# Patient Record
Sex: Male | Born: 1990 | Race: Black or African American | Hispanic: No | Marital: Married | Smoking: Never smoker
Health system: Southern US, Community
[De-identification: ages and names within clinical notes are randomized; demographics above are authoritative.]

## PROBLEM LIST (undated history)

## (undated) DIAGNOSIS — R011 Cardiac murmur, unspecified: Secondary | ICD-10-CM

---
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
## 2017-01-31 ENCOUNTER — Encounter (HOSPITAL_COMMUNITY): Payer: Self-pay | Admitting: Nurse Practitioner

## 2017-01-31 ENCOUNTER — Observation Stay (HOSPITAL_COMMUNITY)
Admission: EM | Admit: 2017-01-31 | Discharge: 2017-02-02 | Disposition: A | Discharge status: Home / Self Care | Payer: 59 | Attending: Internal Medicine | Admitting: Internal Medicine

## 2017-01-31 DIAGNOSIS — M542 Cervicalgia: Secondary | ICD-10-CM | POA: Diagnosis present

## 2017-01-31 DIAGNOSIS — J982 Interstitial emphysema: Principal | ICD-10-CM | POA: Insufficient documentation

## 2017-01-31 DIAGNOSIS — G92 Toxic encephalopathy: Secondary | ICD-10-CM | POA: Diagnosis not present

## 2017-01-31 HISTORY — DX: Cardiac murmur, unspecified: R01.1

## 2017-01-31 MED ORDER — STERILE WATER FOR INJECTION IJ SOLN
INTRAMUSCULAR | Status: AC
  Administered 2017-01-31: 10 mL
  Filled 2017-01-31: qty 10

## 2017-01-31 MED ORDER — ZIPRASIDONE MESYLATE 20 MG IM SOLR
20.0000 mg | Freq: Once | INTRAMUSCULAR | Status: AC
  Administered 2017-01-31: 20 mg via INTRAMUSCULAR
  Filled 2017-01-31: qty 20

## 2017-01-31 NOTE — ED Provider Notes (Signed)
[facility]-EMERGENCY DEPT Provider Note   CSN: [id number] Arrival date & time: 01/31/17  2213  By signing my name below, I, Diona Browner, attest that this documentation has been prepared under the direction and in the presence of [facility], PA-C.  Electronically Signed: Diona Browner, ED Scribe. 01/31/17. 11:22 PM.  LEVEL V CAVEAT: HPI and ROS limited due to altered mental status.   History   Chief Complaint Chief Complaint  Patient presents with  . Hallucinations    HPI Craig Elliott is a 26 y.o. male with a PMHx of anxiety, who presents to the Emergency Department with multiple complaints, He notes he is having a hard time breathing. He can't feel the left side of his body. Pt states that he needs "to keep his spine straight."  States he "I just need to sleep."  States he smoked marijuana cigarette 3 days ago, no other drugs.    Wife states patient has been having trouble sleeping at night x several days.  Today when she got home from work he was explaining that he was having pain in his back, knee, and ankle - was having trouble explaining it, asked her if she could "hear his bones."  He later came to the doorway and mouthed to her that he was having trouble breathing, then dropped, did not pass out.  When EMS arrived he started talking in a way that did not make sense, as he was when he arrived to the ED.    She denies any known psych history for the patient, any known family psych history.   The history is provided by the patient, the spouse and medical records. The history is limited by the condition of the patient. No language interpreter was used.    Past Medical History:  Diagnosis Date  . Heart murmur     There are no active problems to display for this patient.   History reviewed. No pertinent surgical history.     Home Medications    Prior to Admission medications   Not on File    Family History No family history on file.  Social History Social History   Substance Use Topics  . Smoking status: Never Smoker  . Smokeless tobacco: Not on file  . Alcohol use Yes     Allergies   Patient has no known allergies.   Review of Systems Review of Systems  Unable to perform ROS: Mental status change     Physical Exam Updated Vital Signs BP 138/81 (BP Location: Left Arm)   Pulse 84   Resp 18   SpO2 100%   Physical Exam  Constitutional: He appears well-developed and well-nourished. No distress.  HENT:  Head: Normocephalic and atraumatic.  Neck: Neck supple.  Cardiovascular: Normal rate and regular rhythm.   Pulmonary/Chest: Effort normal and breath sounds normal. No respiratory distress. He has no wheezes. He has no rales.  Abdominal: Soft. He exhibits no distension and no mass. There is no tenderness. There is no rebound and no guarding.  Neurological: He is alert. He exhibits normal muscle tone.  Moves erratically and throws his body around the stretcher  Skin: He is not diaphoretic.  Psychiatric: His speech is tangential. He is agitated.  Speech is tangential.  Does not respond to many questions.  Requests blankets and states this will make him okay.  Then states he just needs to sleep. Then jumps up starts talking again.   He is inattentive.  Nursing note and vitals reviewed.  ED Treatments / Results  DIAGNOSTIC STUDIES: Oxygen Saturation is 100% on RA, normal by my interpretation.   COORDINATION OF CARE: 10:47 PM-Discussed next steps with pt. Pt verbalized understanding and is agreeable with the plan.    Labs (all labs ordered are listed, but only abnormal results are displayed) Labs Reviewed  COMPREHENSIVE METABOLIC PANEL - Abnormal; Notable for the following:       Result Value   AST 73 (*)    Alkaline Phosphatase 35 (*)    All other components within normal limits  ACETAMINOPHEN LEVEL - Abnormal; Notable for the following:    Acetaminophen (Tylenol), Serum <10 (*)    All other components within normal limits    ETHANOL  SALICYLATE LEVEL  CBC  RAPID URINE DRUG SCREEN, HOSP PERFORMED    EKG  EKG Interpretation  Date/Time:  Saturday February 01 2017 01:43:45 EDT Ventricular Rate:  68 PR Interval:    QRS Duration: 100 QT Interval:  432 QTC Calculation: 460 R Axis:   87 Text Interpretation:  Sinus rhythm ST elev, probable normal early repol pattern Normal ECG Confirmed by Blinda Leatherwood  MD, CHRISTOPHER [id number]) on 02/01/2017 2:07:21 AM       Radiology Ct Chest Wo Contrast  Result Date: 02/01/2017 CLINICAL DATA:  Followup pneumomediastinum. EXAM: CT CHEST WITHOUT CONTRAST TECHNIQUE: Multidetector CT imaging of the chest was performed following the standard protocol without IV contrast. Oral contrast was administered with contemporaneous scanning. COMPARISON:  CT chest February 01, 2017 at 0329 hours. FINDINGS: CT CHEST FINDINGS- moderate motion degraded examination. CARDIOVASCULAR: Heart and pericardium are unremarkable. Thoracic aorta is normal course and caliber, unremarkable. MEDIASTINUM/NODES: Similar moderate pneumomediastinum. No mediastinal mass. No lymphadenopathy by CT size criteria limited assessment without contrast. Punctate densities LEFT mediastinum are present on prior CT in suggests calcified nodes. No contrast extravasation. Fell pop LUNGS/PLEURA: Tracheobronchial tree is patent, no pneumothorax. No pleural effusions, focal consolidations, pulmonary nodules or masses. MUSCULOSKELETAL: Included soft tissues and included osseous structures appear normal. INCLUDED ABDOMEN:  The contrast in the stomach. IMPRESSION: Moderately motion degraded examination without contrast extravasation or specific etiology for pneumomediastinum. No pneumothorax. Electronically Signed   By: Awilda Metro M.D.   On: 02/01/2017 05:40   Ct Angio Chest Pe W/cm &/or Wo Cm  Result Date: 02/01/2017 CLINICAL DATA:  26 year old male with chest pain and shortness of breath. Earlier substance abuse. EXAM: CT ANGIOGRAPHY CHEST  WITH CONTRAST TECHNIQUE: Multidetector CT imaging of the chest was performed using the standard protocol during bolus administration of intravenous contrast. Multiplanar CT image reconstructions and MIPs were obtained to evaluate the vascular anatomy. CONTRAST:  100 cc Isovue 370 COMPARISON:  None. FINDINGS: Cardiovascular: There is no cardiomegaly or pericardial effusion. The thoracic aorta appears unremarkable. The origins of the great vessels of the aortic arch appear patent. There is no CT evidence of pulmonary embolism. Mediastinum/Nodes: There is pneumomediastinum extending along the course of the esophagus with involvement of the anterior and middle mediastinum. There is air dissection of the fascial planes in the superior mediastinum and along the origin of the great vessels of the aortic arch. There is no mediastinal fluid collection or hematoma. No paraesophageal fluid collection noted. The thyroid gland is grossly unremarkable. No hilar or mediastinal adenopathy. Lungs/Pleura: The lungs are clear. There is no pleural effusion or pneumothorax. The central airways are patent. Minimal amount of air along the lingular mediastinal pleural (series 7, image 69) is likely pneumomediastinum. Upper Abdomen: Fatty infiltration of the liver. The visualized upper abdomen  is otherwise unremarkable. Musculoskeletal: No chest wall abnormality. No acute or significant osseous findings. Review of the MIP images confirms the above findings. IMPRESSION: 1. Moderate pneumomediastinum of indeterminate etiology but concerning for esophageal rupture/perforation. Correlation with clinical exam and history of retching recommended. No mediastinal fluid collection or abscess. 2. No CT evidence of pulmonary embolism or aortic dissection. These results were called by telephone at the time of interpretation on 02/01/2017 at 3:58 am to physician assistant [facility] , who verbally acknowledged these results. Electronically Signed   By:  Elgie Collard M.D.   On: 02/01/2017 04:01    Procedures Procedures (including critical care time)  Medications Ordered in ED Medications  iopamidol (ISOVUE-370) 76 % injection (not administered)  ziprasidone (GEODON) injection 20 mg (20 mg Intramuscular Given 01/31/17 2315)  sterile water (preservative free) injection (10 mLs  Given 01/31/17 2315)  iopamidol (ISOVUE-370) 76 % injection 100 mL (100 mLs Intravenous Contrast Given 02/01/17 0327)  iopamidol (ISOVUE-300) 61 % injection 15 mL (15 mLs Oral Contrast Given 02/01/17 0516)  sodium chloride 0.9 % bolus 1,000 mL (1,000 mLs Intravenous New Bag/Given 02/01/17 0612)     Initial Impression / Assessment and Plan / ED Course  I have reviewed the triage vital signs and the nursing notes.  Pertinent labs & imaging results that were available during my care of the patient were reviewed by me and considered in my medical decision making (see chart for details).  Clinical Course as of Feb 02 644  Sat Feb 01, 2017  0349 Pt now calm and communication is improved.  States that he cracked his back on Tuesday and since has felt very stiff.  States if he extends his neck it hurts in his lower back.  States he feels anxiety because he gait is not normal.  Does admit to his mind racing.  Denies auditory or visual hallucinations.  Denies SI, HI. Also notes he has not eaten today, not urinated today.    [EW]  0407 Received call from radiology, pt with large amount of pneumomediastinum, questions ruptured esophagus.  Upon questioning pt does note he "vomited some black stuff yesterday" and also had diarrhea.  Discussed with Dr Blinda Leatherwood, ED attending.  Cardiothoracic surgery paged.    [EW]  [phone] Discussed pt with Dr Laneta Simmers   [EW]    Clinical Course User Index [EW] Trixie Dredge, PA-C    Afebrile nontoxic patient with odd presentation with erratic behavior and c/o neck and back pain, difficulty breathing.  Labs unremarkable.  CT chest demonstrates  pneumomediastinum.  I spoke with Dr Laneta Simmers, who requested additional CT with oral contrast.  CT demonstrated no extravasation.  Discussed again with Dr Laneta Simmers who recommends hospitalist admission for pain control, likely IVF as pt not eating or drinking due to pain.  Advised air will be reabsorbed in about 1 week, worst pain will be in the first 1-2 days.   Pt does not need to be transferred to [facility], nothing surgical necessary in this case.  Patient and wife updated.  Pt admitted to [facility], Dr Katrinka Blazing accepting.    Final Clinical Impressions(s) / ED Diagnoses   Final diagnoses:  Pneumomediastinum (HCC)    New Prescriptions New Prescriptions   No medications on file   I personally performed the services described in this documentation, which was scribed in my presence. The recorded information has been reviewed and is accurate.     Trixie Dredge, PA-C 02/01/17 5409    Gilda Crease, MD  02/01/17 0701  

## 2017-01-31 NOTE — ED Notes (Signed)
Bed: WA15 Expected date:  Expected time:  Means of arrival:  Comments: EMS  

## 2017-01-31 NOTE — ED Triage Notes (Signed)
Pt states "he is dying and that his wedding ring is keeping alive." Exhibiting bizarre behavior, head bumping, twisting and turning in bed and impulsive yelling. The wife who was momentarily at bedside states that the only psychiatric hx pt has is anxiety but frequently uses marijuana with last known time of use being today.

## 2017-02-01 ENCOUNTER — Emergency Department (HOSPITAL_COMMUNITY): Payer: 59

## 2017-02-01 ENCOUNTER — Encounter (HOSPITAL_COMMUNITY): Payer: Self-pay | Admitting: Radiology

## 2017-02-01 DIAGNOSIS — J982 Interstitial emphysema: Secondary | ICD-10-CM | POA: Diagnosis present

## 2017-02-01 DIAGNOSIS — G9340 Encephalopathy, unspecified: Secondary | ICD-10-CM

## 2017-02-01 LAB — RAPID URINE DRUG SCREEN, HOSP PERFORMED
AMPHETAMINES: NOT DETECTED
BENZODIAZEPINES: NOT DETECTED
Barbiturates: NOT DETECTED
Cocaine: NOT DETECTED
OPIATES: NOT DETECTED
Tetrahydrocannabinol: POSITIVE — AB

## 2017-02-01 LAB — ETHANOL: Alcohol, Ethyl (B): <5 mg/dL (ref <5)

## 2017-02-01 LAB — COMPREHENSIVE METABOLIC PANEL
ALBUMIN: 4.4 g/dL (ref 3.5–5.0)
ALK PHOS: 35 U/L — AB (ref 38–126)
ALT: 47 U/L (ref 17–63)
ANION GAP: 11 (ref 5–15)
AST: 73 U/L — ABNORMAL HIGH (ref 15–41)
BUN: 17 mg/dL (ref 6–20)
CALCIUM: 9.2 mg/dL (ref 8.9–10.3)
CO2: 23 mmol/L (ref 22–32)
CREATININE: 1.11 mg/dL (ref 0.61–1.24)
Chloride: 105 mmol/L (ref 101–111)
GFR calc Af Amer: >60 mL/min (ref >60)
GFR calc non Af Amer: >60 mL/min (ref >60)
GLUCOSE: 90 mg/dL (ref 65–99)
Potassium: 4 mmol/L (ref 3.5–5.1)
SODIUM: 139 mmol/L (ref 135–145)
Total Bilirubin: 1.2 mg/dL (ref 0.3–1.2)
Total Protein: 7.2 g/dL (ref 6.5–8.1)

## 2017-02-01 LAB — CBC
HEMATOCRIT: 41.5 % (ref 39.0–52.0)
HEMOGLOBIN: 14.4 g/dL (ref 13.0–17.0)
MCH: 30.9 pg (ref 26.0–34.0)
MCHC: 34.7 g/dL (ref 30.0–36.0)
MCV: 89.1 fL (ref 78.0–100.0)
PLATELETS: 214 10*3/uL (ref 150–400)
RBC: 4.66 MIL/uL (ref 4.22–5.81)
RDW: 13.2 % (ref 11.5–15.5)
WBC: 10.3 10*3/uL (ref 4.0–10.5)

## 2017-02-01 LAB — SALICYLATE LEVEL: Salicylate Lvl: <7 mg/dL (ref 2.8–30.0)

## 2017-02-01 LAB — ACETAMINOPHEN LEVEL

## 2017-02-01 MED ORDER — IOPAMIDOL (ISOVUE-370) INJECTION 76%
100.0000 mL | Freq: Once | INTRAVENOUS | Status: AC | PRN
  Administered 2017-02-01: 100 mL via INTRAVENOUS

## 2017-02-01 MED ORDER — IOPAMIDOL (ISOVUE-370) INJECTION 76%
INTRAVENOUS | Status: AC
  Filled 2017-02-01: qty 100

## 2017-02-01 MED ORDER — ALPRAZOLAM 0.5 MG PO TABS
0.5000 mg | ORAL_TABLET | Freq: Two times a day (BID) | ORAL | Status: DC | PRN
  Administered 2017-02-01 (×2): 0.5 mg via ORAL
  Filled 2017-02-01 (×2): qty 1

## 2017-02-01 MED ORDER — SODIUM CHLORIDE 0.9 % IV BOLUS (SEPSIS)
1000.0000 mL | Freq: Once | INTRAVENOUS | Status: AC
  Administered 2017-02-01: 1000 mL via INTRAVENOUS

## 2017-02-01 MED ORDER — IOPAMIDOL (ISOVUE-300) INJECTION 61%
15.0000 mL | Freq: Once | INTRAVENOUS | Status: AC | PRN
  Administered 2017-02-01: 15 mL via ORAL

## 2017-02-01 MED ORDER — SODIUM CHLORIDE 0.9% FLUSH: 3.0000 mL | Freq: Two times a day (BID) | INTRAVENOUS | Status: DC

## 2017-02-01 NOTE — Progress Notes (Signed)
Craig Elliott 26 year old male with pmh of anxiety; who initially presented with complaints of hallucinations, weird behaviors, neck/back pain, vomiting, and trouble breathing. CT scan of the chest was performed which revealed pneumomediastinum. CT surgery was consulted and recommended repeat CT scan with contrast to ensure no signs of a ruptured esophagus. Repeat scan was negative and therefore CT surgery recommended conservative management and monitoring. Admit to a telemetry bed for observation.

## 2017-02-01 NOTE — H&P (Addendum)
History and Physical    Craig Elliott ZOX:[id number] DOB: 1991/05/04 DOA: 01/31/2017  Referring MD/NP/PA: PA Trixie Dredge  PCP: No PCP Per Patient    Patient coming from: home  Chief Complaint: neck pain, difficulty breathing   HPI: Craig Elliott is a 26 y.o. male with medical history significant for substance abuse who presented to ED with neck pain and difficulty breathing over past 24 hours prior to this admission. Patient reported smoking marijuana 3 days prior to the admission. He came back home and started feeling neck and back pain and had trouble breathing and shortly thereafter he dropped on floor but did not lose consciousness. EMS arrived and pt then started to be altered, not being aware of the surroundings. He was hallucinating and then brought to ED for further evaluation.   ED Course: Pt was hemodynamically stable in ED. CT scan on admission showed pneumomediastinum so CTS consulted and recommendation was to repeat CT scan which was done and showed stable findings without esophageal rupture. It was recommended that [facility] admits the pt for observation.  Review of Systems:  Constitutional: Negative for fever, chills, diaphoresis, activity change, appetite change and fatigue.  HENT: Negative for ear pain, nosebleeds, congestion, facial swelling, rhinorrhea, positive for neck pain, no neck stiffness and ear discharge.   Eyes: Negative for pain, discharge, redness, itching and visual disturbance.  Respiratory: Negative for cough, choking, shortness of breath, wheezing and stridor.   Cardiovascular: Negative for chest pain, palpitations and leg swelling.  Gastrointestinal: Negative for abdominal distention.  Genitourinary: Negative for dysuria, urgency, frequency, hematuria, flank pain, decreased urine volume, difficulty urinating and dyspareunia.  Musculoskeletal: Negative for back pain, joint swelling, arthralgias and gait problem.  Neurological: Negative for dizziness, tremors,  seizures, syncope, facial asymmetry, speech difficulty, weakness, light-headedness, numbness and headaches.  Hematological: Negative for adenopathy. Does not bruise/bleed easily.  Psychiatric/Behavioral: Negative for hallucinations, behavioral problems, confusion, dysphoric mood, decreased concentration and agitation.   Past Medical History:  Diagnosis Date  . Heart murmur     History reviewed. No pertinent surgical history.  Social history:  reports that he has never smoked. He does not have any smokeless tobacco history on file. He reports that he drinks alcohol. He reports that he uses drugs, including Marijuana.  Ambulation: ambulates without assistance at baseline   No Known Allergies  Family history: hypertension in mother   Prior to Admission medications   Not on File    Physical Exam: Vitals:   02/01/17 0218 02/01/17 0305 02/01/17 0315 02/01/17 0500  BP: 122/74   130/67  Pulse: 78  65 62  Resp: 20     Temp:  98.1 F (36.7 C)    SpO2: 100%  99% 97%    Constitutional: NAD, calm, comfortable Vitals:   02/01/17 0218 02/01/17 0305 02/01/17 0315 02/01/17 0500  BP: 122/74   130/67  Pulse: 78  65 62  Resp: 20     Temp:  98.1 F (36.7 C)    SpO2: 100%  99% 97%   Eyes: PERRL, lids and conjunctivae normal ENMT: Mucous membranes are moist. Posterior pharynx clear of any exudate or lesions.Normal dentition.  Neck: normal, supple, no masses, no thyromegaly Respiratory: clear to auscultation bilaterally, no wheezing, no crackles. Normal respiratory effort. No accessory muscle use.  Cardiovascular: Regular rate and rhythm, no murmurs / rubs / gallops. No extremity edema. 2+ pedal pulses. No carotid bruits.  Abdomen: no tenderness, no masses palpated. No hepatosplenomegaly. Bowel sounds positive.  Musculoskeletal: no clubbing /  cyanosis. No joint deformity upper and lower extremities. Good ROM, no contractures. Normal muscle tone.  Skin: no rashes, lesions, ulcers. No  induration Neurologic: CN 2-12 grossly intact. Sensation intact, DTR normal. Strength 5/5 in all 4.  Psychiatric: No agitation or restlessness    Labs on Admission: I have personally reviewed following labs and imaging studies  CBC:  Recent Labs Lab 02/01/17 0132  WBC 10.3  HGB 14.4  HCT 41.5  MCV 89.1  PLT 214   Basic Metabolic Panel:  Recent Labs Lab 02/01/17 0132  NA 139  K 4.0  CL 105  CO2 23  GLUCOSE 90  BUN 17  CREATININE 1.11  CALCIUM 9.2   GFR: CrCl cannot be calculated (Unknown ideal weight.). Liver Function Tests:  Recent Labs Lab 02/01/17 0132  AST 73*  ALT 47  ALKPHOS 35*  BILITOT 1.2  PROT 7.2  ALBUMIN 4.4   No results for input(s): LIPASE, AMYLASE in the last 168 hours. No results for input(s): AMMONIA in the last 168 hours. Coagulation Profile: No results for input(s): INR, PROTIME in the last 168 hours. Cardiac Enzymes: No results for input(s): CKTOTAL, CKMB, CKMBINDEX, TROPONINI in the last 168 hours. BNP (last 3 results) No results for input(s): PROBNP in the last 8760 hours. HbA1C: No results for input(s): HGBA1C in the last 72 hours. CBG: No results for input(s): GLUCAP in the last 168 hours. Lipid Profile: No results for input(s): CHOL, HDL, LDLCALC, TRIG, CHOLHDL, LDLDIRECT in the last 72 hours. Thyroid Function Tests: No results for input(s): TSH, T4TOTAL, FREET4, T3FREE, THYROIDAB in the last 72 hours. Anemia Panel: No results for input(s): VITAMINB12, FOLATE, FERRITIN, TIBC, IRON, RETICCTPCT in the last 72 hours. Urine analysis: No results found for: COLORURINE, APPEARANCEUR, LABSPEC, PHURINE, GLUCOSEU, HGBUR, BILIRUBINUR, KETONESUR, PROTEINUR, UROBILINOGEN, NITRITE, LEUKOCYTESUR Sepsis Labs: (procalcitonin:4,lacticidven:4) )No results found for this or any previous visit (from the past 240 hour(s)).   Radiological Exams on Admission: Ct Chest Wo Contrast Result Date: 02/01/2017 Moderately motion degraded  examination without contrast extravasation or specific etiology for pneumomediastinum. No pneumothorax. Electronically Signed   By: Awilda Metro M.D.   On: 02/01/2017 05:40   Ct Angio Chest Pe W/cm &/or Wo Cm Result Date: 02/01/2017 1. Moderate pneumomediastinum of indeterminate etiology but concerning for esophageal rupture/perforation. Correlation with clinical exam and history of retching recommended. No mediastinal fluid collection or abscess. 2. No CT evidence of pulmonary embolism or aortic dissection. These results were called by telephone at the time of interpretation on 02/01/2017 at 3:58 am to physician assistant [facility] , who verbally acknowledged these results. Electronically Signed   By: Elgie Collard M.D.   On: 02/01/2017 04:01    EKG: Independently reviewed. Sinus rhythm   Assessment/Plan  Active Problems:   Pneumomediastinum (HCC) - On initial CT scan but when CT scan repeated  CT scan stable without esophageal rupture - Will advance diet to clears - CTS was consulted but since there is no esophageal rupture they recommended conservative management    Acute drug induced encephalopathy - Possibly from THS, UDS positive for THC   DVT prophylaxis: SCD's bilaterally  Code Status: full code Family Communication: father and pt wifeat the bedside Disposition Plan: obs, telemetry  Consults called: CTS Admission status: observation for next 24 hours to make sure pt tolerates regular diet and to make sure his mental status is better.   Manson Passey MD [facility] Pager [phone]  If 7PM-7AM, please contact night-coverage [email or web address] Password [email or web address]  02/01/2017, 7:09 AM

## 2017-02-02 DIAGNOSIS — J982 Interstitial emphysema: Secondary | ICD-10-CM | POA: Diagnosis not present

## 2017-02-02 LAB — HIV ANTIBODY (ROUTINE TESTING W REFLEX): HIV SCREEN 4TH GENERATION: NONREACTIVE

## 2017-02-02 NOTE — Discharge Instructions (Signed)
Pneumomediastinum Pneumomediastinum is the presence of air in the area between the lungs and behind the breastbone (mediastinum). Mild cases of this condition may not cause problems, but severe cases can interfere with the normal functions of the heart and lungs. What are the causes? This condition happens when air leaks out of the lungs, airways, or intestines into the mediastinum. This condition may be caused by:  Childbirth.  An injury to the chest, lung, intestine, esophagus, or abdomen.  Asthma.  Rapid ascent during scuba diving.  Use of a breathing machine (ventilator).  Inhaling or ingesting certain drugs or chemicals.  An infection in the face, neck, chest, or abdomen.  Extreme strain during coughing or vomiting.  Breathing an object into the airway. This condition can also occur on its own. What are the signs or symptoms? Symptoms of this condition include:  Chest pain. The pain may run into your neck, shoulder, back, or arms.  Increased pain when you move, swallow, or take a deep breath.  Problems swallowing.  Problems speaking.  Changes in your voice.  Shortness of breath.  Fever.  Throat or jaw pain. In some cases, especially in cases where the condition occurred on its own, there are no symptoms. How is this diagnosed? This condition may be diagnosed with:  A description of your symptoms.  A physical exam.  Imaging tests, such as a chest X-ray or CT scan. How is this treated? Treatment depends on how severe the condition is and if there are complications. If your condition is mild, you may not need treatment. Your body may slowly reabsorb the air in your mediastinum. You will stay in the hospital for observation and get medicine for pain if you have pain. More severe cases may involve treating the underlying cause. If the air starts to put pressure on your heart or lungs, you may have one or more of these procedures:  Needle aspiration. In this  procedure, a needle is used to remove trapped air.  Chest tube placement. This may be done if your lung collapses.  Surgery. This may be done to repair a hole in the intestine or esophagus. Follow these instructions at home:  Until your health care provider says it is okay, avoid:  Air travel.  Scuba diving.  High altitudes.  Hard physical work.  Exercise.  Do not use any tobacco products, such as cigarettes, chewing tobacco, and e-cigarettes. If you need help quitting, ask your health care provider.  Do not use illegal drugs.  Take over-the-counter and prescription medicines only as told by your health care provider. Contact a health care provider if:  You have a fever. Get help right away if:  You have worsening pain in your chest, neck, jaw, or arms.  You have trouble breathing.  You have new problems with speaking or swallowing. This information is not intended to replace advice given to you by your health care provider. Make sure you discuss any questions you have with your health care provider. Document Released: 09/05/2008 Document Revised: 10/24/2015 Document Reviewed: 07/21/2015 Elsevier Interactive Patient Education  2017 [employer].

## 2017-02-02 NOTE — Discharge Summary (Signed)
Physician Discharge Summary  Craig Elliott WUJ:[id number] DOB: [birth year] DOA: 01/31/2017  PCP: No PCP Per Patient  Admit date: 01/31/2017 Discharge date: 02/02/2017  Recommendations for Outpatient Follow-up:  1. Pt will need to follow up with PCP in 2-3 weeks post discharge 2. Please obtain BMP to evaluate electrolytes and kidney function 3. Please also check CBC to evaluate Hg and Hct levels   Discharge Diagnoses:  Active Problems:   Pneumomediastinum [facility])   Discharge Condition: Stable  Diet recommendation: Heart healthy diet discussed in details   History of present illness:  26 y.o. male with medical history significant for substance abuse who presented to ED with neck pain and difficulty breathing over past 24 hours prior to this admission. Patient reported smoking marijuana 3 days prior to the admission. He came back home and started feeling neck and back pain and had trouble breathing and shortly thereafter he dropped on floor but did not lose consciousness.   Hospital Course:  Active Problems:   Pneumomediastinum (HCC) - CTS was consulted but since there is no esophageal rupture they recommended conservative management - pt tolerating diet well, wants to go home     Acute drug induced encephalopathy - Possibly from THS, UDS positive for THC - resolved - pt wants to go home   Procedures/Studies: Ct Chest Wo Contrast  Result Date: 02/01/2017 CLINICAL DATA:  Followup pneumomediastinum. EXAM: CT CHEST WITHOUT CONTRAST TECHNIQUE: Multidetector CT imaging of the chest was performed following the standard protocol without IV contrast. Oral contrast was administered with contemporaneous scanning. COMPARISON:  CT chest February 01, 2017 at 0329 hours. FINDINGS: CT CHEST FINDINGS- moderate motion degraded examination. CARDIOVASCULAR: Heart and pericardium are unremarkable. Thoracic aorta is normal course and caliber, unremarkable. MEDIASTINUM/NODES: Similar moderate  pneumomediastinum. No mediastinal mass. No lymphadenopathy by CT size criteria limited assessment without contrast. Punctate densities LEFT mediastinum are present on prior CT in suggests calcified nodes. No contrast extravasation. Fell pop LUNGS/PLEURA: Tracheobronchial tree is patent, no pneumothorax. No pleural effusions, focal consolidations, pulmonary nodules or masses. MUSCULOSKELETAL: Included soft tissues and included osseous structures appear normal. INCLUDED ABDOMEN:  The contrast in the stomach. IMPRESSION: Moderately motion degraded examination without contrast extravasation or specific etiology for pneumomediastinum. No pneumothorax. Electronically Signed   By: Awilda Metro M.D.   On: 02/01/2017 05:40   Ct Angio Chest Pe W/cm &/or Wo Cm  Result Date: 02/01/2017 CLINICAL DATA:  27 year old male with chest pain and shortness of breath. Earlier substance abuse. EXAM: CT ANGIOGRAPHY CHEST WITH CONTRAST TECHNIQUE: Multidetector CT imaging of the chest was performed using the standard protocol during bolus administration of intravenous contrast. Multiplanar CT image reconstructions and MIPs were obtained to evaluate the vascular anatomy. CONTRAST:  100 cc Isovue 370 COMPARISON:  None. FINDINGS: Cardiovascular: There is no cardiomegaly or pericardial effusion. The thoracic aorta appears unremarkable. The origins of the great vessels of the aortic arch appear patent. There is no CT evidence of pulmonary embolism. Mediastinum/Nodes: There is pneumomediastinum extending along the course of the esophagus with involvement of the anterior and middle mediastinum. There is air dissection of the fascial planes in the superior mediastinum and along the origin of the great vessels of the aortic arch. There is no mediastinal fluid collection or hematoma. No paraesophageal fluid collection noted. The thyroid gland is grossly unremarkable. No hilar or mediastinal adenopathy. Lungs/Pleura: The lungs are clear. There  is no pleural effusion or pneumothorax. The central airways are patent. Minimal amount of air along the lingular  mediastinal pleural (series 7, image 69) is likely pneumomediastinum. Upper Abdomen: Fatty infiltration of the liver. The visualized upper abdomen is otherwise unremarkable. Musculoskeletal: No chest wall abnormality. No acute or significant osseous findings. Review of the MIP images confirms the above findings. IMPRESSION: 1. Moderate pneumomediastinum of indeterminate etiology but concerning for esophageal rupture/perforation. Correlation with clinical exam and history of retching recommended. No mediastinal fluid collection or abscess. 2. No CT evidence of pulmonary embolism or aortic dissection. These results were called by telephone at the time of interpretation on 02/01/2017 at 3:58 am to physician assistant [facility] , who verbally acknowledged these results. Electronically Signed   By: Elgie Collard M.D.   On: 02/01/2017 04:01     Discharge Exam: Vitals:   02/01/17 2121 02/02/17 0544  BP: (!) 117/57 (!) 141/64  Pulse: 61 89  Resp: 18 18  Temp: 98.2 F (36.8 C) 98.2 F (36.8 C)   Vitals:   02/01/17 0820 02/01/17 1458 02/01/17 2121 02/02/17 0544  BP: 130/63 140/61 (!) 117/57 (!) 141/64  Pulse: 64 (!) 56 61 89  Resp: Temp: 98.3 F (36.8 C)  98.2 F (36.8 C) 98.2 F (36.8 C)  TempSrc: Oral Oral Oral Oral  SpO2: 100% 99% 100% 96%  Weight: 101.1 kg (222 lb 14.4 oz)     Height:   (1.854 m)      General: Pt is alert, follows commands appropriately, not in acute distress Cardiovascular: Regular rate and rhythm, S1/S2 +, no murmurs, no rubs, no gallops Respiratory: Clear to auscultation bilaterally, no wheezing, no crackles, no rhonchi Abdominal: Soft, non tender, non distended, bowel sounds +, no guarding Extremities: no edema, no cyanosis, pulses palpable bilaterally DP and PT Neuro: Grossly nonfocal  Discharge Instructions  Discharge Instructions     Diet - low sodium heart healthy    Complete by:  As directed    Increase activity slowly    Complete by:  As directed      Allergies as of 02/02/2017   No Known Allergies     Medication List    You have not been prescribed any medications.       The results of significant diagnostics from this hospitalization (including imaging, microbiology, ancillary and laboratory) are listed below for reference.     Microbiology: No results found for this or any previous visit (from the past 240 hour(s)).   Labs: Basic Metabolic Panel:  Recent Labs Lab 02/01/17 0132  NA 139  K 4.0  CL 105  CO2 23  GLUCOSE 90  BUN 17  CREATININE 1.11  CALCIUM 9.2   Liver Function Tests:  Recent Labs Lab 02/01/17 0132  AST 73*  ALT 47  ALKPHOS 35*  BILITOT 1.2  PROT 7.2  ALBUMIN 4.4   CBC:  Recent Labs Lab 02/01/17 0132  WBC 10.3  HGB 14.4  HCT 41.5  MCV 89.1  PLT 214    SIGNED: Time coordinating discharge: 30 minutes  Debbora Presto, MD  [facility] 02/02/2017, 10:31 AM Pager [phone]  If 7PM-7AM, please contact night-coverage [email or web address] Password [email or web address]

## 2017-02-10 ENCOUNTER — Ambulatory Visit (INDEPENDENT_AMBULATORY_CARE_PROVIDER_SITE_OTHER): Payer: 59 | Admitting: Medical

## 2017-02-10 ENCOUNTER — Encounter: Payer: Self-pay | Admitting: Medical

## 2017-02-10 VITALS — BP 110/70 | HR 90 | Ht 77.0 in | Wt 228.8 lb

## 2017-02-10 DIAGNOSIS — J982 Interstitial emphysema: Secondary | ICD-10-CM | POA: Diagnosis not present

## 2017-02-10 DIAGNOSIS — F1290 Cannabis use, unspecified, uncomplicated: Secondary | ICD-10-CM

## 2017-02-10 DIAGNOSIS — R7989 Other specified abnormal findings of blood chemistry: Secondary | ICD-10-CM | POA: Diagnosis not present

## 2017-02-10 DIAGNOSIS — R945 Abnormal results of liver function studies: Secondary | ICD-10-CM

## 2017-02-10 NOTE — Progress Notes (Signed)
Subjective: Chief Complaint  Patient presents with  . Hospitalization Follow-up    new pt hospital follow up [facility]  on 01/31/2017    Here with wife to establish care.  Accompanied by his wife today, she is pregnant with their 2nd child. Also has a 25yo son.  Been in [place] for about a year.   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   He recent went to [facility] ED on 01/31/17 for 2 day hospital stay after experiencing SOB subsequently falling to the ground, seeming somewhat disoriented/altered mental status.  There was reportedly abnormality on chest CT.  He had CT, EKG, labs, among his evaluation.  No specific cause found for pneumomediastinum.  The AMS was thought to be related to marijuana use.    He otherwise was in usual state of health, no prior similar.  He notes he has been gradually getting back into cardio exercise after a 4 mo hiatus.  Prior to 68mo would play full court basketball 4 days per week.  He denies recent GERD issues, but does eat spicy foods.  He drinks maybe 2 alcoholic drinks per month.   No other aggravating or relieving factors. No other complaint.  Past Medical History:  Diagnosis Date  . Heart murmur    No current outpatient prescriptions on file prior to visit.   No current facility-administered medications on file prior to visit.    ROS as in subjective   Objective: BP 110/70   Pulse 90   Ht 6\' 5"  (1.956 m)   Wt 228 lb 12.8 oz (103.8 kg)   SpO2 98%   BMI 27.13 kg/m   General appearance: alert, no distress, WD/WN, AA male HEENT: normocephalic, sclerae anicteric, PERRLA, EOMi, nares patent, no discharge or erythema, pharynx normal Oral cavity: MMM, no lesions Neck: supple, no lymphadenopathy, no thyromegaly, no masses, no bruits Heart: RRR, normal S1, S2, no murmurs Lungs: CTA bilaterally, no wheezes, rhonchi, or rales Abdomen: +bs, soft, non tender, non distended, no masses, no hepatomegaly, no  splenomegaly Back: non tender Musculoskeletal: nontender, no swelling, no obvious deformity Extremities: no edema, no cyanosis, no clubbing Pulses: 2+ symmetric, upper and lower extremities, normal cap refill Neurological: alert, oriented x 3, CN2-12 intact, strength normal upper extremities and lower extremities, sensation normal throughout, DTRs 2+ throughout, no cerebellar signs, gait normal Psychiatric: normal affect, behavior normal, pleasant    Assessment: Encounter Diagnoses  Name Primary?  . Pneumomediastinum (HCC) Yes  . Elevated LFTs   . Marijuana user     Plan: Reviewed the 01/31/17 hospitalization records, discharge summary, CT chest, EKG, labs.   I recommended repeat liver tests.  He declines labs today, wants to defer so he can think about what he wants to do.    discussed possible causes of elevated LFTs, and no major risk factors.  Similarly, discussed risk factors for pneumomediastinum, and he really doesn't have typical risk factors.    Discussed his marijuana use, recommended he reduce this/stop.    Discussed symptoms that would prompt urgent evaluation, call to 911 otherwise.  f/u within a few weeks for recheck, labs.    Yohannes was seen today for hospitalization follow-up.  Diagnoses and all orders for this visit:  Pneumomediastinum (HCC)  Elevated LFTs  Marijuana user

## 2018-02-16 IMAGING — CT CT ANGIO CHEST
2 of 6 series · 18 of 36 positions shown · IV contrast (ISOVUE 370)
Comparison: None.

CLINICAL DATA: 26-year-old male with chest pain and shortness of
breath. Earlier substance abuse.

EXAM:
CT ANGIOGRAPHY CHEST WITH CONTRAST
TECHNIQUE: Multidetector CT imaging of the chest was performed using the
standard protocol during bolus administration of intravenous
contrast. Multiplanar CT image reconstructions and MIPs were
obtained to evaluate the vascular anatomy.
CONTRAST:  100 cc Isovue 370

[Series 6: thins for pacs · axial · 0.76mm/px · z∈[-338,-52]mm · 17 of 318 slices shown]
[im 16/318  lung]
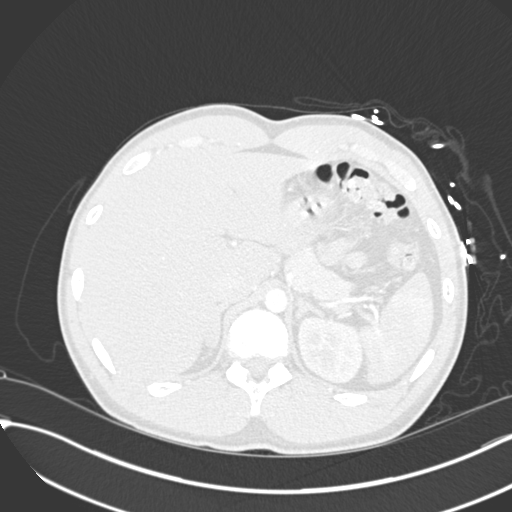
[im 32/318  mediastinal]
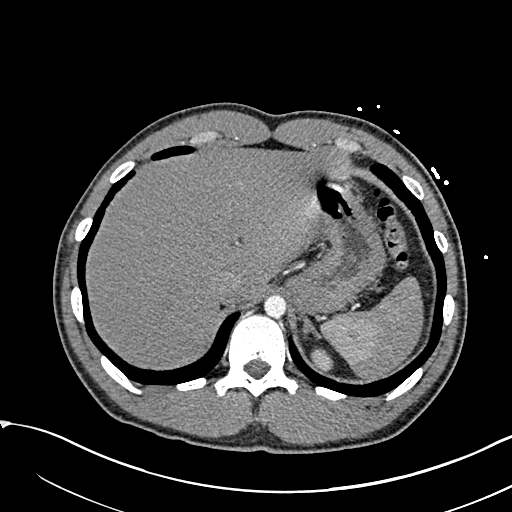
[im 48/318  lung]
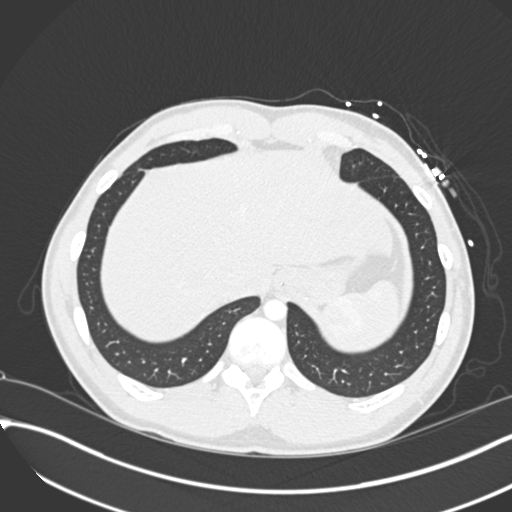
[im 64/318  mediastinal]
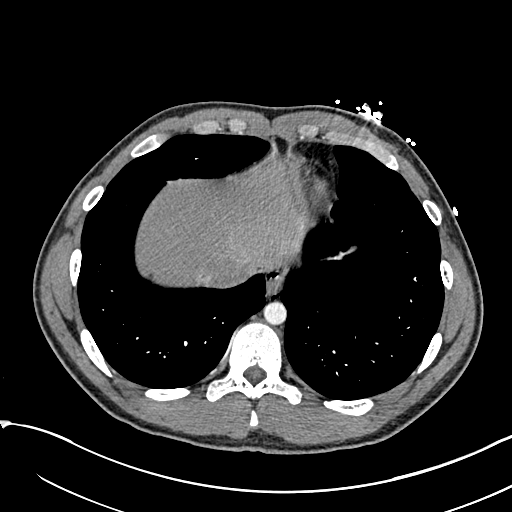
[im 96/318  lung]
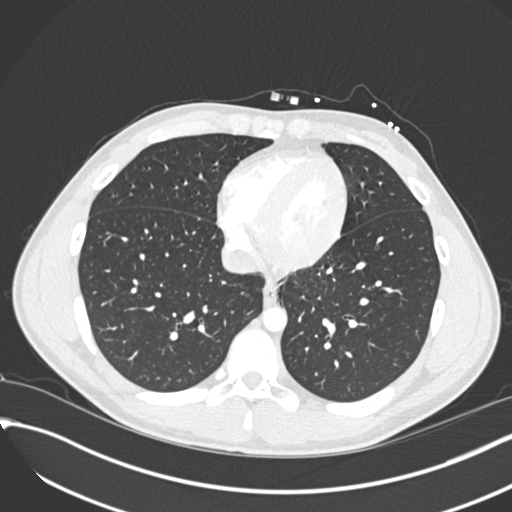
[im 111/318  mediastinal]
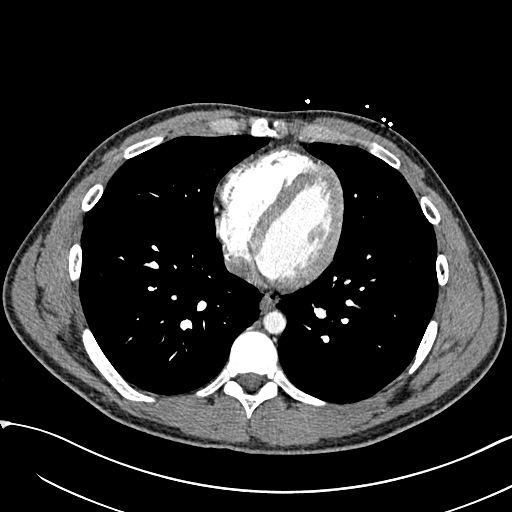
[im 127/318  lung]
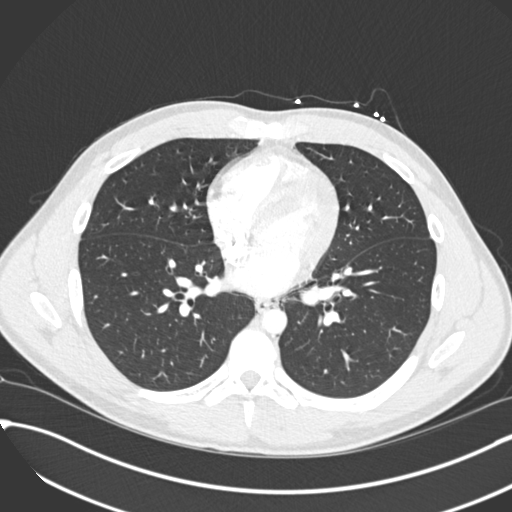
[im 143/318  mediastinal]
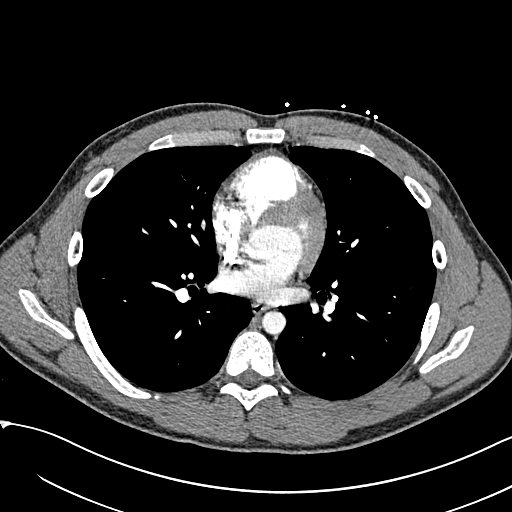
[im 159/318  lung]
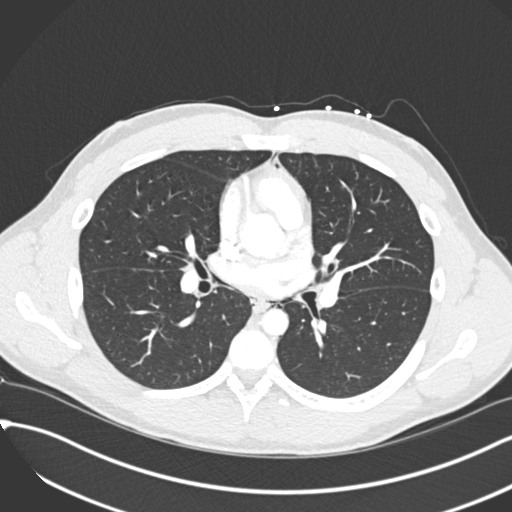
[im 175/318  mediastinal]
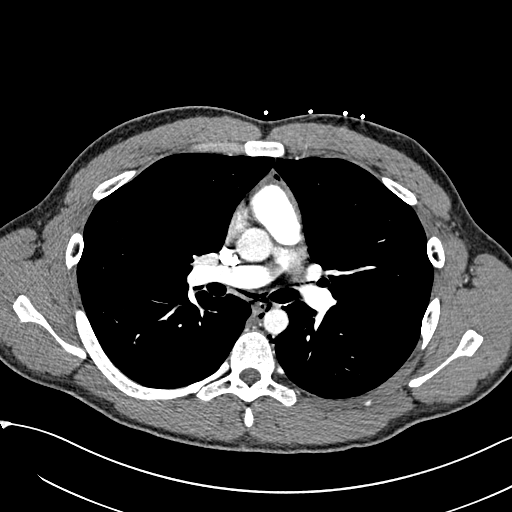
[im 191/318  lung]
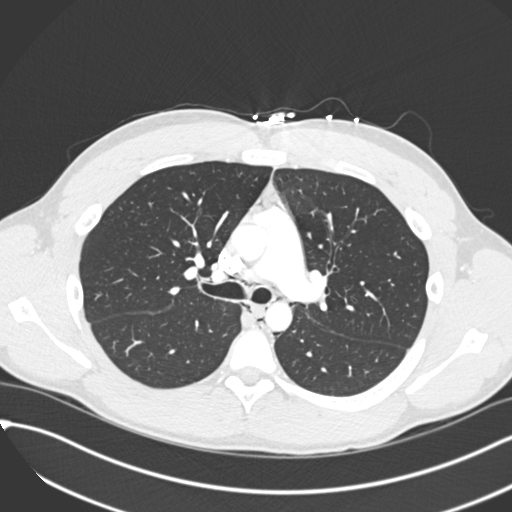
[im 207/318  mediastinal]
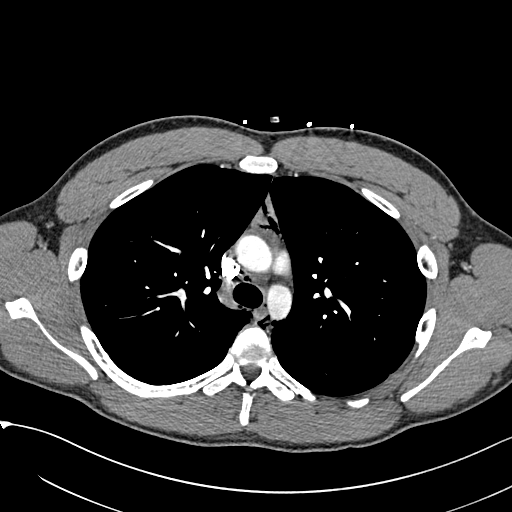
[im 222/318  lung]
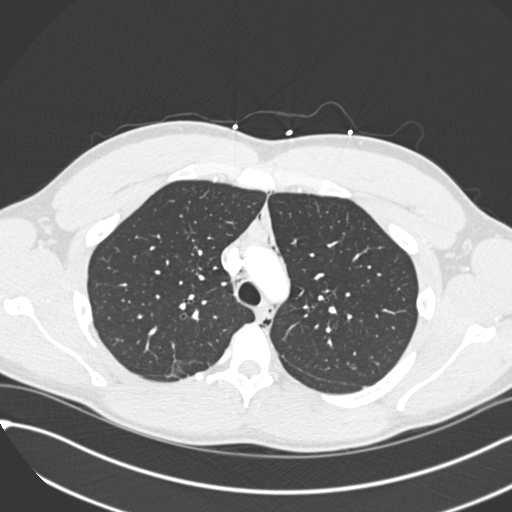
[im 254/318  mediastinal]
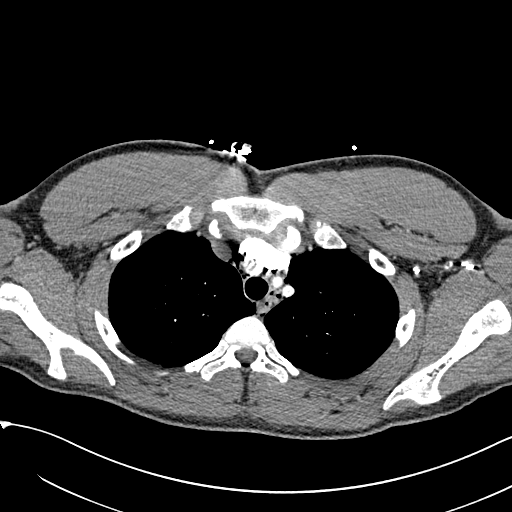
[im 270/318  lung]
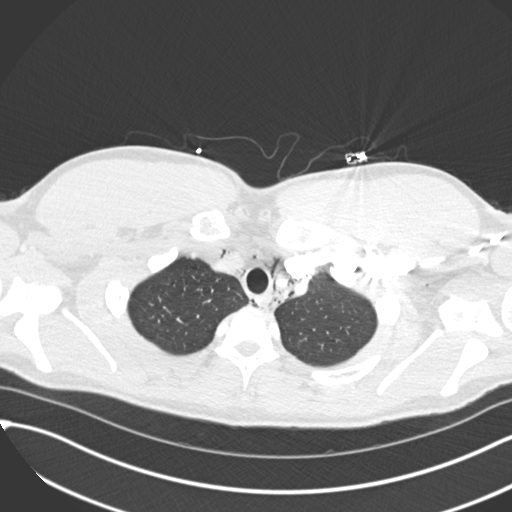
[im 286/318  mediastinal]
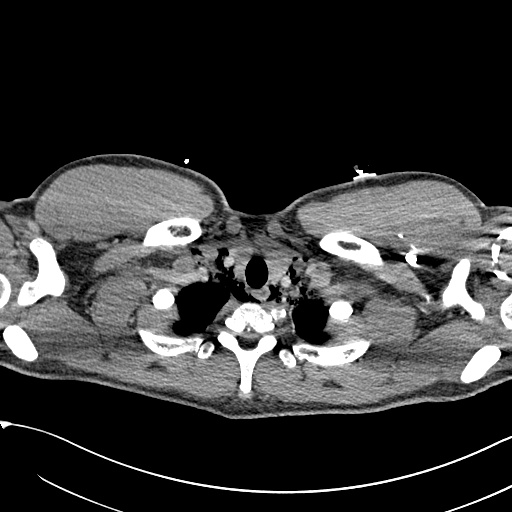
[im 302/318  lung]
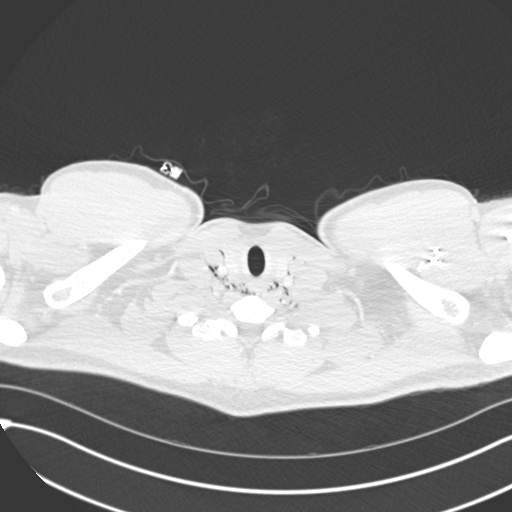

[Series 8: coronal mpr · coronal · 0.63mm/px · 1 of 131 slices shown]
[im 66/131  mediastinal]
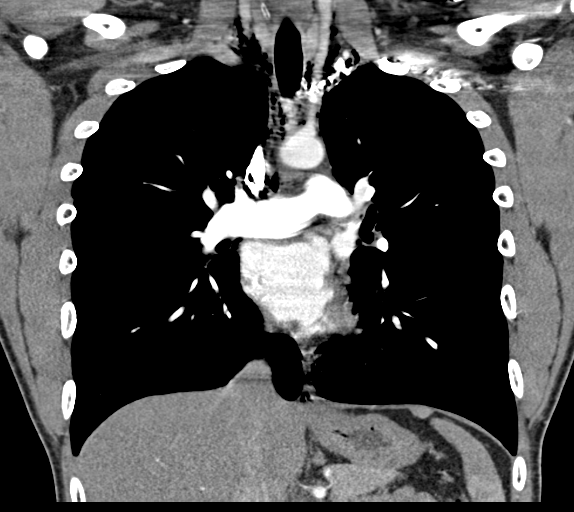

[18 of 36 positions shown; findings below may reference images not displayed]

FINDINGS: Cardiovascular: There is no cardiomegaly or pericardial effusion.
The thoracic aorta appears unremarkable. The origins of the great
vessels of the aortic arch appear patent. There is no CT evidence of
pulmonary embolism.

Mediastinum/Nodes: There is pneumomediastinum extending along the
course of the esophagus with involvement of the anterior and middle
mediastinum. There is air dissection of the fascial planes in the
superior mediastinum and along the origin of the great vessels of
the aortic arch. There is no mediastinal fluid collection or
hematoma. No paraesophageal fluid collection noted. The thyroid
gland is grossly unremarkable. No hilar or mediastinal adenopathy.

Lungs/Pleura: The lungs are clear. There is no pleural effusion or
pneumothorax. The central airways are patent. Minimal amount of air
along the lingular mediastinal pleural (series 7, image 69) is
likely pneumomediastinum.

Upper Abdomen: Fatty infiltration of the liver. The visualized upper
abdomen is otherwise unremarkable.

Musculoskeletal: No chest wall abnormality. No acute or significant
osseous findings.

Review of the MIP images confirms the above findings.
IMPRESSION: 1. Moderate pneumomediastinum of indeterminate etiology but
concerning for esophageal rupture/perforation. Correlation with
clinical exam and history of retching recommended. No mediastinal
fluid collection or abscess.
2. No CT evidence of pulmonary embolism or aortic dissection.
These results were called by telephone at the time of interpretation
on 02/01/2017 at [DATE] to physician Shendrit Yehdego , who
verbally acknowledged these results.

## 2018-09-10 ENCOUNTER — Telehealth: Payer: Self-pay | Admitting: Medical

## 2018-09-10 NOTE — Telephone Encounter (Signed)
Dismissal letter in guarantor snapshot  °

## 2022-05-14 ENCOUNTER — Other Ambulatory Visit: Payer: Self-pay

## 2022-05-14 ENCOUNTER — Emergency Department (HOSPITAL_COMMUNITY)
Admission: EM | Admit: 2022-05-14 | Discharge: 2022-05-14 | Disposition: A | Discharge status: Home / Self Care | Payer: No Typology Code available for payment source | Attending: Emergency Medicine | Admitting: Emergency Medicine

## 2022-05-14 ENCOUNTER — Emergency Department (HOSPITAL_COMMUNITY): Payer: Self-pay

## 2022-05-14 DIAGNOSIS — S6391XA Sprain of unspecified part of right wrist and hand, initial encounter: Secondary | ICD-10-CM | POA: Insufficient documentation

## 2022-05-14 DIAGNOSIS — M79641 Pain in right hand: Secondary | ICD-10-CM | POA: Diagnosis present

## 2022-05-14 DIAGNOSIS — Y92410 Unspecified street and highway as the place of occurrence of the external cause: Secondary | ICD-10-CM | POA: Diagnosis not present

## 2022-05-14 NOTE — Discharge Instructions (Addendum)
Recheck with your workers comp provider in 2 days.  Can apply ice and elevate for 20 minutes at a time. Motrin and Tylenol as needed as directed for pain.
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
## 2022-05-14 NOTE — ED Notes (Signed)
Patient verbalizes understanding of discharge instructions. Opportunity for questioning and answers were provided. Armband removed by staff, pt discharged from ED and ambulated to lobby to return home.
